# Patient Record
Sex: Female | Born: 1975 | Race: Black or African American | Hispanic: No | Smoking: Never smoker
Health system: Southern US, Community
[De-identification: ages and names within clinical notes are randomized; demographics above are authoritative.]

---

## 2001-06-14 ENCOUNTER — Emergency Department (HOSPITAL_COMMUNITY): Admission: EM | Admit: 2001-06-14 | Discharge: 2001-06-14 | Payer: Self-pay | Admitting: *Deleted

## 2005-07-31 ENCOUNTER — Other Ambulatory Visit: Admission: RE | Admit: 2005-07-31 | Discharge: 2005-07-31 | Payer: Self-pay | Admitting: Obstetrics and Gynecology

## 2005-10-02 ENCOUNTER — Other Ambulatory Visit: Admission: RE | Admit: 2005-10-02 | Discharge: 2005-10-02 | Payer: Self-pay | Admitting: Obstetrics and Gynecology

## 2009-03-04 ENCOUNTER — Other Ambulatory Visit: Admission: RE | Admit: 2009-03-04 | Discharge: 2009-03-04 | Payer: Self-pay | Admitting: Obstetrics and Gynecology

## 2013-07-19 ENCOUNTER — Ambulatory Visit: Payer: 59

## 2013-07-19 ENCOUNTER — Ambulatory Visit (INDEPENDENT_AMBULATORY_CARE_PROVIDER_SITE_OTHER): Payer: 59 | Admitting: Emergency Medicine

## 2013-07-19 VITALS — BP 132/80 | HR 91 | Temp 98.0°F | Resp 17 | Ht 64.5 in | Wt 200.0 lb

## 2013-07-19 DIAGNOSIS — M779 Enthesopathy, unspecified: Secondary | ICD-10-CM

## 2013-07-19 DIAGNOSIS — M79609 Pain in unspecified limb: Secondary | ICD-10-CM

## 2013-07-19 DIAGNOSIS — M79645 Pain in left finger(s): Secondary | ICD-10-CM

## 2013-07-19 NOTE — Progress Notes (Signed)
   Subjective:    Patient ID: Yvonne Bridges, female    DOB: 10/01/1975, 37 y.o.   MRN: [id number]  HPI Pt went from working 8 to 12 hour shifts at work. She says her pinky finger feels stiff, shooting pains in left hand. She works for [employer] and does a lot of typing. She says she just noticed it about a month ago.    Review of Systems     Objective:   Physical Exam there is no swelling of the wrist. She does have pain with flexion of her fifth finger against resistance.  [facility] reading (PRIMARY) by  Dr.Dorrance Sellick there is no fracture seen. Lateral not available of the hand.         Assessment & Plan:  Patient will take Aleve 2 twice a day. She will keep her splint on. She will be on 8 hour days for the next month due to tendinitis of her left fifth finger.

## 2013-07-24 ENCOUNTER — Telehealth: Payer: Self-pay

## 2013-07-24 NOTE — Telephone Encounter (Signed)
Patient called this morning checking status on FMLA ppw. She was seen on Dec 10 by Dr Cleta Alberts and she says she gave her ppw to Rebekah in the room while she was being seen. Did not see the ppw in provider box or disablity desk. Is there anywhere else it may be? Says she needs it done asap. Cb# [phone] (work # she works at [employer])

## 2013-07-25 NOTE — Telephone Encounter (Signed)
Found paperwork, faxed to [facility] and left copy for patient at front desk.

## 2013-07-25 NOTE — Telephone Encounter (Signed)
To my knowledge this was already done. I filled out the paperwork already. Please check within health care to see if they have received it.

## 2013-08-29 ENCOUNTER — Telehealth: Payer: Self-pay

## 2013-08-29 DIAGNOSIS — M79646 Pain in unspecified finger(s): Secondary | ICD-10-CM

## 2013-08-29 NOTE — Telephone Encounter (Signed)
I will extend her FMLA for 2 weeks. She must see the orthopedist since she is staying out longer than I discussed. Please put referral in to Dr. Amanda PeaGramig or Dr. Melvyn Novasrtmann for evaluation.

## 2013-08-29 NOTE — Telephone Encounter (Signed)
Dr. Cleta Albertsaub,  You filled out FMLA for patient and gave her 1 month. She is requesting 3 months because she is still having issues with her hand. I put the paperwork back in your box. Let me know if you will do this, or if she needs to be seen again, etc. I will let patient know what you decide.  Thank you, Dala DockEliana

## 2013-08-30 NOTE — Telephone Encounter (Signed)
Pt faxed paperwork to dr. Cleta Albertsaub. Advised her of ortho referral

## 2013-08-31 NOTE — Telephone Encounter (Signed)
Faxed a copy to [facility] and left a copy for patient to pickup.

## 2013-09-11 ENCOUNTER — Encounter: Payer: Self-pay | Admitting: Emergency Medicine

## 2013-09-15 ENCOUNTER — Encounter: Payer: Self-pay | Admitting: Emergency Medicine

## 2014-05-09 IMAGING — CR DG HAND COMPLETE 3+V*L*
2 series · 2 of 2 positions shown · non-contrast
Comparison: None.

CLINICAL DATA: Left hand pain

EXAM:
LEFT HAND - COMPLETE 3+ VIEW

[PA]
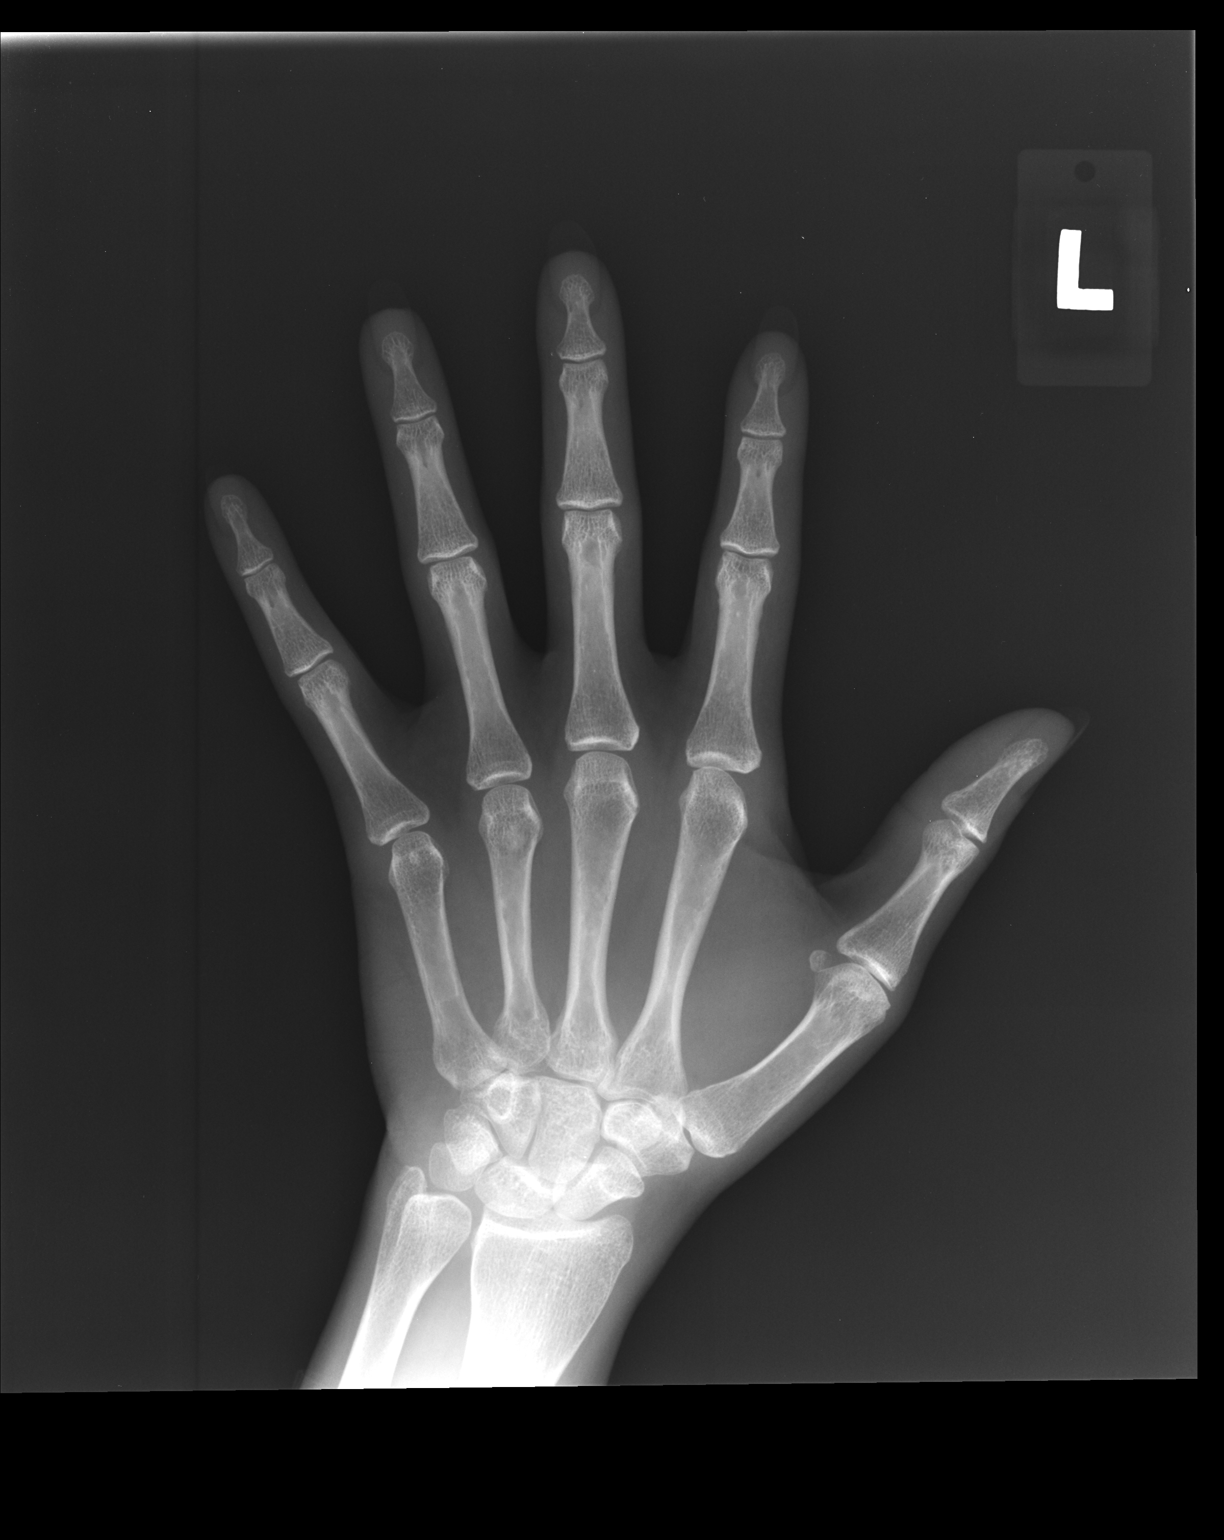

[pa obl]
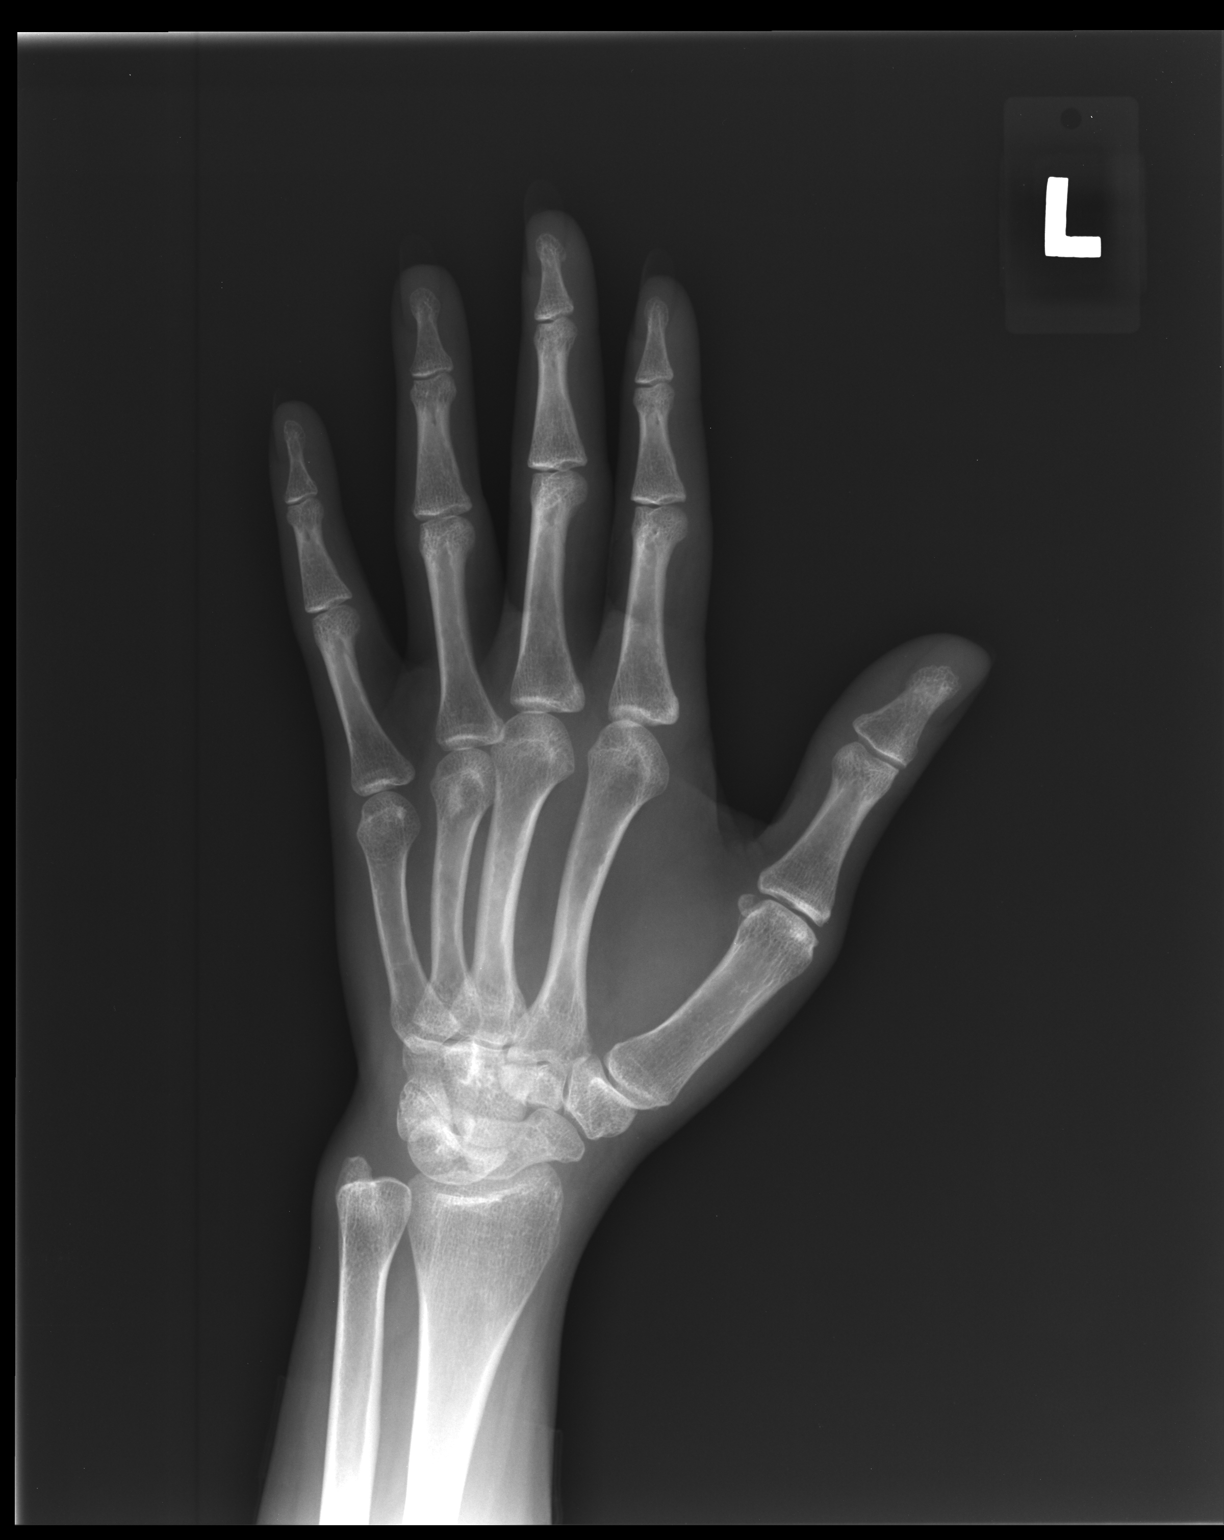

[2 of 2 positions shown; findings below may reference images not displayed]

FINDINGS: There is no evidence of fracture or dislocation. There is no
evidence of arthropathy or other focal bone abnormality. Soft
tissues are unremarkable.
IMPRESSION: No acute abnormality noted.

## 2017-12-08 ENCOUNTER — Emergency Department (HOSPITAL_COMMUNITY): Payer: 59

## 2017-12-08 ENCOUNTER — Emergency Department (HOSPITAL_COMMUNITY)
Admission: EM | Admit: 2017-12-08 | Discharge: 2017-12-08 | Disposition: A | Discharge status: Home / Self Care | Payer: 59 | Attending: Emergency Medicine | Admitting: Emergency Medicine

## 2017-12-08 ENCOUNTER — Encounter: Payer: Self-pay | Admitting: Emergency Medicine

## 2017-12-08 DIAGNOSIS — R531 Weakness: Secondary | ICD-10-CM | POA: Insufficient documentation

## 2017-12-08 DIAGNOSIS — Z79899 Other long term (current) drug therapy: Secondary | ICD-10-CM | POA: Insufficient documentation

## 2017-12-08 DIAGNOSIS — R079 Chest pain, unspecified: Secondary | ICD-10-CM | POA: Diagnosis not present

## 2017-12-08 DIAGNOSIS — R002 Palpitations: Secondary | ICD-10-CM | POA: Diagnosis not present

## 2017-12-08 DIAGNOSIS — R69 Illness, unspecified: Secondary | ICD-10-CM | POA: Diagnosis not present

## 2017-12-08 DIAGNOSIS — R5383 Other fatigue: Secondary | ICD-10-CM | POA: Diagnosis not present

## 2017-12-08 DIAGNOSIS — Z563 Stressful work schedule: Secondary | ICD-10-CM | POA: Diagnosis not present

## 2017-12-08 LAB — BASIC METABOLIC PANEL
ANION GAP: 12 (ref 5–15)
BUN: 8 mg/dL (ref 6–20)
CO2: 25 mmol/L (ref 22–32)
Calcium: 9.6 mg/dL (ref 8.9–10.3)
Chloride: 105 mmol/L (ref 101–111)
Creatinine, Ser: 0.63 mg/dL (ref 0.44–1.00)
GFR calc Af Amer: >60 mL/min (ref >60)
GLUCOSE: 97 mg/dL (ref 65–99)
POTASSIUM: 3.9 mmol/L (ref 3.5–5.1)
Sodium: 142 mmol/L (ref 135–145)

## 2017-12-08 LAB — I-STAT BETA HCG BLOOD, ED (MC, WL, AP ONLY): I-stat hCG, quantitative: <5 m[IU]/mL (ref <5)

## 2017-12-08 LAB — CBC
HEMATOCRIT: 41 % (ref 36.0–46.0)
HEMOGLOBIN: 14.1 g/dL (ref 12.0–15.0)
MCH: 32 pg (ref 26.0–34.0)
MCHC: 34.4 g/dL (ref 30.0–36.0)
MCV: 93 fL (ref 78.0–100.0)
Platelets: 294 10*3/uL (ref 150–400)
RBC: 4.41 MIL/uL (ref 3.87–5.11)
RDW: 12.1 % (ref 11.5–15.5)
WBC: 6.4 10*3/uL (ref 4.0–10.5)

## 2017-12-08 LAB — I-STAT TROPONIN, ED
TROPONIN I, POC: 0 ng/mL (ref 0.00–0.08)
Troponin i, poc: 0 ng/mL (ref 0.00–0.08)

## 2017-12-08 NOTE — ED Triage Notes (Signed)
Pt was at urgent care for evaluation of weakness X 10 days. Pt states she works 3 jobs and is very stressed. Pt began to have a panic attack in triage, talked pt through taking slow deep breaths and calming down, pt was able to calm herself down to get an EKG. EKG showed NSR. Pt states at urgent care she ate pizza and fries she began having left sided chest pain while at urgent care. Pt also endorses left arm tingling.

## 2017-12-08 NOTE — ED Notes (Signed)
Pt verbalized understanding discharge instructions and denies any further needs or questions at this time. VS stable, ambulatory and steady gait.   

## 2017-12-08 NOTE — Discharge Instructions (Signed)
Please read and follow all provided instructions.  Your diagnoses today include:  1. Palpitations    Tests performed today include:  An EKG of your heart  A chest x-ray  Cardiac enzymes - a blood test for heart muscle damage  Blood counts and electrolytes  Vital signs. See below for your results today.   Medications prescribed:   None  Take any prescribed medications only as directed.  Follow-up instructions: Please follow-up with your primary care provider.  Please follow-up with the outpatient referrals given to you so you can get help with your stressors and help managing these.  Return instructions:  SEEK IMMEDIATE MEDICAL ATTENTION IF:  You have severe chest pain, especially if the pain is crushing or pressure-like and spreads to the arms, back, neck, or jaw, or if you have sweating, nausea (feeling sick to your stomach), or shortness of breath. THIS IS AN EMERGENCY. Don't wait to see if the pain will go away. Get medical help at once. Call 911 or 0 (operator). DO NOT drive yourself to the hospital.   Your chest pain gets worse and does not go away with rest.   You have an attack of chest pain lasting longer than usual, despite rest and treatment with the medications your caregiver has prescribed.   You wake from sleep with chest pain or shortness of breath.  You feel dizzy or faint.  You have chest pain not typical of your usual pain for which you originally saw your caregiver.   You have any other emergent concerns regarding your health.  Additional Information: Chest pain comes from many different causes. Your caregiver has diagnosed you as having chest pain that is not specific for one problem, but does not require admission.  You are at low risk for an acute heart condition or other serious illness.   Your vital signs today were: BP 127/88    Pulse 81    Temp 99.6 F (37.6 C)    Resp 18    SpO2 100%  If your blood pressure (BP) was elevated above 135/85  this visit, please have this repeated by your doctor within one month. --------------

## 2017-12-08 NOTE — ED Provider Notes (Signed)
[facility] EMERGENCY DEPARTMENT Provider Note   CSN: [id number] Arrival date & time: 12/08/17  1249     History   Chief Complaint Chief Complaint  Patient presents with  . Chest Pain    HPI Yvonne Bridges is a 42 y.o. female.  Patient with no significant past medical history presents with complaint of generalized weakness, fatigue, palpitations ongoing over the past couple of weeks.  Patient reports going to urgent care prior to the ED and it was requested that she come to the emergency department for evaluation of her heart.  Patient reports feeling heart skipping, especially at night.  She denies any chest pains but did have a short-lived episode after eating pizza and fried chicken today.  This has been resolved during ED stay.  Patient reports being under a very large amount of stress and she feels very overwhelmed.  She states that she recently left her boyfriend and needs to find a new place to move this month.  She has been working 2-3 jobs, quit 1 of them 10 days ago.  She has not been eating well or sleeping well.  She gets approximately 6 hours of sleep per night.  Also reports that her grandmother who raised her passed away several months ago. She denies suicidal or homicidal ideation -- but does wish she had someone to talk to. Patient denies any history of hypertension, high cholesterol, diabetes, smoking, family history of coronary artery disease.  She has an occasional glass of wine.  Denies excessive caffeine or OTC medication use. .Patient denies risk factors for pulmonary embolism including: unilateral leg swelling, history of DVT/PE/other blood clots, use of exogenous hormones, recent immobilizations, recent surgery, recent travel (>4hr segment), malignancy, hemoptysis.       No past medical history on file.  There are no active problems to display for this patient.   The histories are not reviewed yet. Please review them in the "History" navigator  section and refresh this SmartLink.   OB History   None      Home Medications    Prior to Admission medications   Medication Sig Start Date End Date Taking? Authorizing Provider  Multiple Vitamins-Minerals (MULTIVITAMIN WITH MINERALS) tablet Take 1 tablet by mouth daily. organic   Yes [provider]    Family History No family history on file.  Social History Social History   Tobacco Use  . Smoking status: Never Smoker  Substance Use Topics  . Alcohol use: Not on file  . Drug use: Not on file     Allergies   Patient has no known allergies.   Review of Systems Review of Systems  Constitutional: Positive for fatigue. Negative for diaphoresis and fever.  Eyes: Negative for redness.  Respiratory: Negative for cough and shortness of breath.   Cardiovascular: Positive for palpitations. Negative for chest pain and leg swelling.  Gastrointestinal: Negative for abdominal pain, nausea and vomiting.  Genitourinary: Negative for dysuria.  Musculoskeletal: Negative for back pain and neck pain.  Skin: Negative for rash.  Neurological: Positive for weakness. Negative for syncope and light-headedness.  Psychiatric/Behavioral: The patient is not nervous/anxious.      Physical Exam Updated Vital Signs BP 127/88   Pulse 81   Temp 99.6 F (37.6 C)   Resp 18   SpO2 100%   Physical Exam  Constitutional: She appears well-developed and well-nourished.  HENT:  Head: Normocephalic and atraumatic.  Mouth/Throat: Mucous membranes are normal. Mucous membranes are not dry.  Eyes: Conjunctivae are normal.  Neck: Trachea normal and normal range of motion. Neck supple. Normal carotid pulses and no JVD present. No muscular tenderness present. Carotid bruit is not present. No tracheal deviation present.  Cardiovascular: Normal rate, regular rhythm, S1 normal, S2 normal, normal heart sounds and intact distal pulses. Exam reveals no decreased pulses.  No murmur  heard. Pulmonary/Chest: Effort normal. No respiratory distress. She has no wheezes. She exhibits no tenderness.  Abdominal: Soft. Normal aorta and bowel sounds are normal. There is no tenderness. There is no rebound and no guarding.  Musculoskeletal: Normal range of motion.  Neurological: She is alert.  Skin: Skin is warm and dry. She is not diaphoretic. No cyanosis. No pallor.  Psychiatric: Her speech is normal. Her mood appears anxious. Her affect is labile. She exhibits a depressed mood. She expresses no homicidal and no suicidal ideation.  Nursing note and vitals reviewed.    ED Treatments / Results  Labs (all labs ordered are listed, but only abnormal results are displayed) Labs Reviewed  BASIC METABOLIC PANEL  CBC  I-STAT TROPONIN, ED  I-STAT BETA HCG BLOOD, ED ([facility], [facility], [facility] ONLY)  I-STAT TROPONIN, ED    EKG EKG Interpretation  Date/Time:  Wednesday Dec 08 2017 12:54:12 EDT Ventricular Rate:  96 PR Interval:  110 QRS Duration: 80 QT Interval:  338 QTC Calculation: 427 R Axis:   62 Text Interpretation:  Sinus rhythm with short PR Nonspecific T wave abnormality Abnormal ECG No previous ECGs available Confirmed by Alvira Monday ([id number]) on 12/08/2017 4:02:28 PM  ED ECG REPORT   Date: 12/08/2017  Rate: 64  Rhythm: normal sinus rhythm  QRS Axis: normal  Intervals: normal  ST/T Wave abnormalities: normal  Conduction Disutrbances:none  Narrative Interpretation:   Old EKG Reviewed: unchanged  I have personally reviewed the EKG tracing and agree with the computerized printout as noted.  Radiology Dg Chest 2 View  Result Date: 12/08/2017 CLINICAL DATA:  Chest pain EXAM: CHEST - 2 VIEW COMPARISON:  None. FINDINGS: The heart size and mediastinal contours are within normal limits. Both lungs are clear. The visualized skeletal structures are unremarkable. IMPRESSION: No active cardiopulmonary disease. Electronically Signed   By: Elige Ko   On: 12/08/2017 13:25     Procedures Procedures (including critical care time)  Medications Ordered in ED Medications - No data to display   Initial Impression / Assessment and Plan / ED Course  I have reviewed the triage vital signs and the nursing notes.  Pertinent labs & imaging results that were available during my care of the patient were reviewed by me and considered in my medical decision making (see chart for details).     Patient seen and examined. Work-up reviewed with patient.  Will check a delta troponin and repeat EKG.  Anticipate discharge to home if normal.  Discussed outpatient treatment for stressor, anxiety, and possible depression.  She states that she has some support through her work and is going to look into that.  I will also provide her with outpatient resources and I have encouraged her to follow-up.  Vital signs reviewed and are as follows: BP 127/88   Pulse 81   Temp 99.6 F (37.6 C)   Resp 18   SpO2 100%   6:43 PM Patient updated on results.  Second troponin and repeat EKG are unchanged.  We will discharged home.  Encourage PCP follow-up and follow-up with outpatient referrals.  Encouraged to return with development of chest pain, shortness  of breath, lightheadedness or syncope, new symptoms or other concerns.  Final Clinical Impressions(s) / ED Diagnoses   Final diagnoses:  Palpitations   Patient with palpitations, brief CP after eating -- HEART score low risk. EKG and trop neg x 2.  Feel that stress is likely big contributor to the patient's palpitation sensation.  She is asymptomatic during ED stay.  Return and follow-up instructions as above.   ED Discharge Orders    None       Renne Crigler, Cordelia Poche 12/08/17 1850    Alvira Monday, MD 12/09/17 1325
